# Patient Record
Sex: Male | Born: 1980 | Race: White | Hispanic: No | Smoking: Former smoker
Health system: Southern US, Community
[De-identification: ages and names within clinical notes are randomized; demographics above are authoritative.]

## PROBLEM LIST (undated history)

## (undated) DIAGNOSIS — E7800 Pure hypercholesterolemia, unspecified: Secondary | ICD-10-CM

## (undated) HISTORY — PX: HERNIA REPAIR: SHX51

---

## 2018-10-31 ENCOUNTER — Emergency Department (HOSPITAL_BASED_OUTPATIENT_CLINIC_OR_DEPARTMENT_OTHER): Payer: 59

## 2018-10-31 ENCOUNTER — Observation Stay (HOSPITAL_BASED_OUTPATIENT_CLINIC_OR_DEPARTMENT_OTHER)
Admission: EM | Admit: 2018-10-31 | Discharge: 2018-11-01 | Disposition: A | Discharge status: Home / Self Care | Payer: 59 | Attending: Internal Medicine | Admitting: Internal Medicine

## 2018-10-31 ENCOUNTER — Other Ambulatory Visit: Payer: Self-pay

## 2018-10-31 ENCOUNTER — Encounter (HOSPITAL_BASED_OUTPATIENT_CLINIC_OR_DEPARTMENT_OTHER): Payer: Self-pay | Admitting: *Deleted

## 2018-10-31 DIAGNOSIS — R079 Chest pain, unspecified: Secondary | ICD-10-CM | POA: Diagnosis not present

## 2018-10-31 DIAGNOSIS — R778 Other specified abnormalities of plasma proteins: Secondary | ICD-10-CM

## 2018-10-31 DIAGNOSIS — E7800 Pure hypercholesterolemia, unspecified: Secondary | ICD-10-CM | POA: Diagnosis not present

## 2018-10-31 DIAGNOSIS — R0789 Other chest pain: Secondary | ICD-10-CM | POA: Diagnosis present

## 2018-10-31 DIAGNOSIS — Z87891 Personal history of nicotine dependence: Secondary | ICD-10-CM | POA: Diagnosis not present

## 2018-10-31 DIAGNOSIS — R7989 Other specified abnormal findings of blood chemistry: Secondary | ICD-10-CM

## 2018-10-31 DIAGNOSIS — R001 Bradycardia, unspecified: Secondary | ICD-10-CM | POA: Diagnosis present

## 2018-10-31 DIAGNOSIS — K219 Gastro-esophageal reflux disease without esophagitis: Secondary | ICD-10-CM | POA: Diagnosis not present

## 2018-10-31 HISTORY — DX: Pure hypercholesterolemia, unspecified: E78.00

## 2018-10-31 LAB — APTT: aPTT: 26 seconds (ref 24–36)

## 2018-10-31 LAB — BASIC METABOLIC PANEL
Anion gap: 8 (ref 5–15)
BUN: 14 mg/dL (ref 6–20)
CHLORIDE: 104 mmol/L (ref 98–111)
CO2: 26 mmol/L (ref 22–32)
Calcium: 9.4 mg/dL (ref 8.9–10.3)
Creatinine, Ser: 1.14 mg/dL (ref 0.61–1.24)
GFR calc Af Amer: >60 mL/min (ref >60)
GFR calc non Af Amer: >60 mL/min (ref >60)
GLUCOSE: 108 mg/dL — AB (ref 70–99)
Potassium: 3.5 mmol/L (ref 3.5–5.1)
Sodium: 138 mmol/L (ref 135–145)

## 2018-10-31 LAB — CBC
HCT: 47.3 % (ref 39.0–52.0)
HEMOGLOBIN: 15.5 g/dL (ref 13.0–17.0)
MCH: 28.7 pg (ref 26.0–34.0)
MCHC: 32.8 g/dL (ref 30.0–36.0)
MCV: 87.4 fL (ref 80.0–100.0)
Platelets: 265 10*3/uL (ref 150–400)
RBC: 5.41 MIL/uL (ref 4.22–5.81)
RDW: 13 % (ref 11.5–15.5)
WBC: 7.3 10*3/uL (ref 4.0–10.5)
nRBC: 0 % (ref 0.0–0.2)

## 2018-10-31 LAB — HEPATIC FUNCTION PANEL
ALK PHOS: 47 U/L (ref 38–126)
ALT: 37 U/L (ref 0–44)
AST: 30 U/L (ref 15–41)
Albumin: 4.5 g/dL (ref 3.5–5.0)
Bilirubin, Direct: 0.1 mg/dL (ref 0.0–0.2)
Indirect Bilirubin: 0.3 mg/dL (ref 0.3–0.9)
Total Bilirubin: 0.4 mg/dL (ref 0.3–1.2)
Total Protein: 7.4 g/dL (ref 6.5–8.1)

## 2018-10-31 LAB — PROTIME-INR
INR: 0.94
Prothrombin Time: 12.5 seconds (ref 11.4–15.2)

## 2018-10-31 LAB — LIPASE, BLOOD: Lipase: 38 U/L (ref 11–51)

## 2018-10-31 LAB — BRAIN NATRIURETIC PEPTIDE: B Natriuretic Peptide: 24.7 pg/mL (ref 0.0–100.0)

## 2018-10-31 LAB — D-DIMER, QUANTITATIVE: D-Dimer, Quant: <0.27 ug/mL-FEU (ref 0.00–0.50)

## 2018-10-31 LAB — TROPONIN I
TROPONIN I: 0.05 ng/mL — AB (ref <0.03)
Troponin I: 0.05 ng/mL (ref <0.03)

## 2018-10-31 MED ORDER — PANTOPRAZOLE SODIUM 40 MG PO TBEC
40.0000 mg | DELAYED_RELEASE_TABLET | Freq: Every day | ORAL | Status: DC
  Administered 2018-11-01: 40 mg via ORAL
  Filled 2018-10-31: qty 1

## 2018-10-31 MED ORDER — ONDANSETRON HCL 4 MG/2ML IJ SOLN: 4.0000 mg | Freq: Four times a day (QID) | INTRAMUSCULAR | Status: DC | PRN

## 2018-10-31 MED ORDER — ASPIRIN EC 325 MG PO TBEC
325.0000 mg | DELAYED_RELEASE_TABLET | Freq: Every day | ORAL | Status: DC
  Administered 2018-11-01: 325 mg via ORAL
  Filled 2018-10-31: qty 1

## 2018-10-31 MED ORDER — MORPHINE SULFATE (PF) 2 MG/ML IV SOLN: 2.0000 mg | INTRAVENOUS | Status: DC | PRN

## 2018-10-31 MED ORDER — ACETAMINOPHEN 325 MG PO TABS
650.0000 mg | ORAL_TABLET | ORAL | Status: DC | PRN
  Administered 2018-11-01: 650 mg via ORAL
  Filled 2018-10-31: qty 2

## 2018-10-31 MED ORDER — ASPIRIN 81 MG PO CHEW
324.0000 mg | CHEWABLE_TABLET | Freq: Once | ORAL | Status: AC
  Administered 2018-10-31: 324 mg via ORAL
  Filled 2018-10-31: qty 4

## 2018-10-31 NOTE — ED Notes (Signed)
Patient transported to X-ray 

## 2018-10-31 NOTE — H&P (Signed)
History and Physical    Ray Reed RUE:[id number] DOB: [birth year] DOA: 10/31/2018  PCP: No primary care provider on file.  Patient coming from: Home.  Chief Complaint: Chest pain.  HPI: Ray Reed is a 37 y.o. male with no significant past medical history presents to the ER with complaint of chest pain.  Patient is visiting [place] from [place].  Has been experiencing chest pain last 3 days.  On Friday 3 days ago patient had brief episode of chest pain which resolved without any intervention.  Had again chest pain history while driving which also resolved without any intervention.  Today while at backing up his back he started developing chest pain again with some shortness of breath and diaphoresis with nausea.  Denies any abdominal pain.  Due to the symptoms patient came to the ER.  ED Course: In the ER patient's troponin was mildly elevated EKG was showing deprivation with possible PR depression.  Chest x-ray was unremarkable patient admitted for further work-up of chest pain.  Review of Systems: As per HPI, rest all negative.   Past Medical History:  Diagnosis Date  . High cholesterol     Past Surgical History:  Procedure Laterality Date  . HERNIA REPAIR       reports that he has quit smoking. He has never used smokeless tobacco. He reports current alcohol use. He reports previous drug use.  No Known Allergies  Family History  Problem Relation Age of Onset  . CAD Neg Hx   . Diabetes Mellitus II Neg Hx     Prior to Admission medications   Medication Sig Start Date End Date Taking? Authorizing Provider  omeprazole (PRILOSEC) 20 MG capsule Take 20 mg by mouth daily.   Yes [provider]    Physical Exam: Vitals:   10/31/18 1850 10/31/18 1930 10/31/18 2014 10/31/18 2200  BP: 124/75 132/77 125/75 (!) 141/80  Pulse: 60 63 69 67  Resp: 14 11 16 20   Temp: 98.6 F (37 C)   98.6 F (37 C)  TempSrc: Oral     SpO2: 100% 99% 100% 100%  Weight:       Height:          Constitutional: Moderately built and nourished. Vitals:   10/31/18 1850 10/31/18 1930 10/31/18 2014 10/31/18 2200  BP: 124/75 132/77 125/75 (!) 141/80  Pulse: 60 63 69 67  Resp: 14 11 16 20   Temp: 98.6 F (37 C)   98.6 F (37 C)  TempSrc: Oral     SpO2: 100% 99% 100% 100%  Weight:      Height:       Eyes: Anicteric no pallor. ENMT: No discharge from the ears eyes nose or mouth. Neck: No mass felt.  No neck rigidity no JVD appreciated. Respiratory: No rhonchi or crepitations. Cardiovascular: S1-S2 heard no murmurs appreciated. Abdomen: Soft nontender bowel sounds present. Musculoskeletal: No edema.  No joint effusion. Skin: No rash. Neurologic: Alert awake oriented to time place and person.  Moves all extremities. Psychiatric: Appears normal per normal affect.   Labs on Admission: I have personally reviewed following labs and imaging studies  CBC: Recent Labs  Lab 10/31/18 1730  WBC 7.3  HGB 15.5  HCT 47.3  MCV 87.4  PLT 265   Basic Metabolic Panel: Recent Labs  Lab 10/31/18 1730  NA 138  K 3.5  CL 104  CO2 26  GLUCOSE 108*  BUN 14  CREATININE 1.14  CALCIUM 9.4   GFR: Estimated Creatinine  Clearance: 106 mL/min (by C-G formula based on SCr of 1.14 mg/dL). Liver Function Tests: Recent Labs  Lab 10/31/18 1730  AST 30  ALT 37  ALKPHOS 47  BILITOT 0.4  PROT 7.4  ALBUMIN 4.5   Recent Labs  Lab 10/31/18 1730  LIPASE 38   No results for input(s): AMMONIA in the last 168 hours. Coagulation Profile: Recent Labs  Lab 10/31/18 1730  INR 0.94   Cardiac Enzymes: Recent Labs  Lab 10/31/18 1730 10/31/18 2010  TROPONINI 0.05* 0.05*   BNP (last 3 results) No results for input(s): PROBNP in the last 8760 hours. HbA1C: No results for input(s): HGBA1C in the last 72 hours. CBG: No results for input(s): GLUCAP in the last 168 hours. Lipid Profile: No results for input(s): CHOL, HDL, LDLCALC, TRIG, CHOLHDL, LDLDIRECT in the  last 72 hours. Thyroid Function Tests: No results for input(s): TSH, T4TOTAL, FREET4, T3FREE, THYROIDAB in the last 72 hours. Anemia Panel: No results for input(s): VITAMINB12, FOLATE, FERRITIN, TIBC, IRON, RETICCTPCT in the last 72 hours. Urine analysis: No results found for: COLORURINE, APPEARANCEUR, LABSPEC, PHURINE, GLUCOSEU, HGBUR, BILIRUBINUR, KETONESUR, PROTEINUR, UROBILINOGEN, NITRITE, LEUKOCYTESUR Sepsis Labs: @LABRCNTIP (procalcitonin:4,lacticidven:4) )No results found for this or any previous visit (from the past 240 hour(s)).   Radiological Exams on Admission: Dg Chest 2 View  Result Date: 10/31/2018 CLINICAL DATA:  Chest pain EXAM: CHEST - 2 VIEW COMPARISON:  None. FINDINGS: Heart and mediastinal contours are within normal limits. No focal opacities or effusions. No acute bony abnormality. IMPRESSION: No active cardiopulmonary disease. Electronically Signed   By: Charlett NoseKevin  Dover M.D.   On: 10/31/2018 17:27    EKG: Independently reviewed.  Normal sinus rhythm with J-point elevation probably secondary to early repolarization changes with possible PR depression.  Assessment/Plan Principal Problem:   Chest pain Active Problems:   Chest pain, rule out acute myocardial infarction    1. Chest pain -with elevated positive troponin we will keep patient on aspirin we will cycle further cardiac markers check d-dimer since patient has possible PR depression will avoid Lovenox for now.  Check 2D echo sed rate cardiology consult.  Check urine drug screen. 2. Previous episode of high-altitude pulmonary edema.   DVT prophylaxis: SCDs. Code Status: Full code. Family Communication: Patient's family at the bedside. Disposition Plan: Home. Consults called: Cardiology. Admission status: Observation.   Eduard ClosArshad N Tressa Maldonado MD [facility] Pager [phone].  If 7PM-7AM, please contact night-coverage [email or web address] Password [email or web address]  10/31/2018, 11:14 PM

## 2018-10-31 NOTE — ED Notes (Addendum)
Pt had pulmonary edema 1 year ago and felt he needed to be checked today. Pt states he has chest pain that he rates 2/10 and left shoulder pain that has been ongoing for approx. 2 days. Pt states the pain is intermitent and is also having bouts of diaphoresis and anxiety. He states that it feels like his heart intermittently races and then he feels like he needs to have a bowel movement.

## 2018-10-31 NOTE — ED Provider Notes (Signed)
[facility] EMERGENCY DEPARTMENT Provider Note   CSN: [id number] Arrival date & time: 10/31/18  1650     History   Chief Complaint Chief Complaint  Patient presents with  . Chest Pain    HPI Ray Reed is a 37 y.o. male with a past medical history of high cholesterol, GERD, who presents today for evaluation of 2 out of 10 chest discomfort and left shoulder pain.  He says that this is been going on for approximately 2 weeks however has gotten gradually worse today.  He says the pain is intermittent and comes and goes.  He went to an urgent care initially where they were concerned and sent him to the emergency room.  He says that this made him feel extremely anxious.  This is corroborated by his wife who is in the room who tells me that he was telling her to run red lights to get here.    Patient has a remote history of high-altitude pulmonary edema that he got 1 year ago in [place], however has not had any issues since.  He reports that he will start feeling very anxious and then get nauseous and diaphoretic.  He has not vomited.  He denies any abdominal pain constipation or diarrhea.  He does note that he is very active, snowboards, and plays hockey.  He is visiting here.  He denies any history of blood clots.  He denies any leg swelling, hemoptysis, prolonged surgeries or immobilization in the past 4 weeks.  He does not use any hormones.    HPI  Past Medical History:  Diagnosis Date  . High cholesterol     Patient Active Problem List   Diagnosis Date Noted  . Chest pain, rule out acute myocardial infarction 10/31/2018  . Chest pain 10/31/2018    Past Surgical History:  Procedure Laterality Date  . HERNIA REPAIR          Home Medications    Prior to Admission medications   Medication Sig Start Date End Date Taking? Authorizing Provider  omeprazole (PRILOSEC) 20 MG capsule Take 20 mg by mouth daily.   Yes [provider]    Family  History Family History  Problem Relation Age of Onset  . CAD Neg Hx   . Diabetes Mellitus II Neg Hx     Social History Social History   Tobacco Use  . Smoking status: Former Games developer  . Smokeless tobacco: Never Used  Substance Use Topics  . Alcohol use: Yes    Comment: 10 drinks max over a weekend  . Drug use: Not Currently     Allergies   Patient has no known allergies.   Review of Systems Review of Systems  Constitutional: Positive for diaphoresis. Negative for chills and fever.  HENT: Negative for congestion.   Respiratory: Negative for chest tightness and shortness of breath.   Cardiovascular: Positive for chest pain. Negative for palpitations and leg swelling.  Gastrointestinal: Positive for nausea. Negative for abdominal pain.  Genitourinary: Negative for dysuria.  Musculoskeletal: Negative for back pain and neck pain.  Psychiatric/Behavioral: Negative for confusion. The patient is nervous/anxious.   All other systems reviewed and are negative.    Physical Exam Updated Vital Signs BP (!) 141/80   Pulse 67   Temp 98.6 F (37 C)   Resp 20   Ht 6\' 3"  (1.905 m)   Wt 98.9 kg   SpO2 100%   BMI 27.25 kg/m   Physical Exam Vitals signs and nursing  note reviewed.  Constitutional:      General: He is not in acute distress.    Appearance: He is well-developed and normal weight. He is not toxic-appearing or diaphoretic.  HENT:     Head: Normocephalic and atraumatic.  Eyes:     Conjunctiva/sclera: Conjunctivae normal.  Neck:     Musculoskeletal: Normal range of motion and neck supple.     Vascular: No JVD.  Cardiovascular:     Rate and Rhythm: Normal rate and regular rhythm.     Pulses:          Posterior tibial pulses are 2+ on the right side and 2+ on the left side.     Heart sounds: Normal heart sounds. No murmur.  Pulmonary:     Effort: Pulmonary effort is normal. No accessory muscle usage or respiratory distress.     Breath sounds: Normal breath  sounds. No decreased breath sounds, wheezing, rhonchi or rales.  Chest:     Chest wall: Tenderness (Diffuse tenderness over anterior chest, primarily over sternocostal joints, palpation in these areas both re-creates and exacerbates his reported pain.) present.  Abdominal:     Palpations: Abdomen is soft.     Tenderness: There is no abdominal tenderness.  Musculoskeletal: Normal range of motion.     Right lower leg: He exhibits no tenderness. No edema.     Left lower leg: He exhibits no tenderness. No edema.  Skin:    General: Skin is warm and dry.  Neurological:     Mental Status: He is alert and oriented to person, place, and time.     Motor: No weakness.  Psychiatric:        Mood and Affect: Mood is anxious (Fidgeting).      ED Treatments / Results  Labs (all labs ordered are listed, but only abnormal results are displayed) Labs Reviewed  BASIC METABOLIC PANEL - Abnormal; Notable for the following components:      Result Value   Glucose, Bld 108 (*)    All other components within normal limits  TROPONIN I - Abnormal; Notable for the following components:   Troponin I 0.05 (*)    All other components within normal limits  CBC  HEPATIC FUNCTION PANEL  LIPASE, BLOOD  BRAIN NATRIURETIC PEPTIDE  PROTIME-INR  APTT  D-DIMER, QUANTITATIVE (NOT AT [facility])  TROPONIN I    EKG EKG Interpretation  Date/Time:  Sunday October 31 2018 16:59:50 EST Ventricular Rate:  68 PR Interval:  154 QRS Duration: 96 QT Interval:  394 QTC Calculation: 418 R Axis:   82 Text Interpretation:  Normal sinus rhythm with sinus arrhythmia Normal ECG Nonspecific TW changes lead III J point notching likely benign early repolarization Mild PR depression No previous ECGs available Confirmed by Alvira MondaySchlossman, Erin ([id number]) on 10/31/2018 6:18:13 PM   Radiology Dg Chest 2 View  Result Date: 10/31/2018 CLINICAL DATA:  Chest pain EXAM: CHEST - 2 VIEW COMPARISON:  None. FINDINGS: Heart and mediastinal  contours are within normal limits. No focal opacities or effusions. No acute bony abnormality. IMPRESSION: No active cardiopulmonary disease. Electronically Signed   By: Charlett NoseKevin  Dover M.D.   On: 10/31/2018 17:27    Procedures Procedures (including critical care time) CRITICAL CARE Performed by: Lyndel SafeElizabeth Daquavion Catala Total critical care time: 40 minutes Critical care time was exclusive of separately billable procedures and treating other patients. Critical care was necessary to treat or prevent imminent or life-threatening deterioration. Critical care was time spent personally by me on the following  activities: development of treatment plan with patient and/or surrogate as well as nursing, discussions with consultants, evaluation of patient's response to treatment, examination of patient, obtaining history from patient or surrogate, ordering and performing treatments and interventions, ordering and review of laboratory studies, ordering and review of radiographic studies, pulse oximetry and re-evaluation of patient's condition.  Elevated troponin, transfer for admission.     Medications Ordered in ED Medications  pantoprazole (PROTONIX) EC tablet 40 mg (has no administration in time range)  acetaminophen (TYLENOL) tablet 650 mg (has no administration in time range)  ondansetron (ZOFRAN) injection 4 mg (has no administration in time range)  morphine 2 MG/ML injection 2 mg (has no administration in time range)  aspirin EC tablet 325 mg (has no administration in time range)  aspirin chewable tablet 324 mg (324 mg Oral Given 10/31/18 1827)     Initial Impression / Assessment and Plan / ED Course  I have reviewed the triage vital signs and the nursing notes.  Pertinent labs & imaging results that were available during my care of the patient were reviewed by me and considered in my medical decision making (see chart for details).  Clinical Course as of Nov 01 121  Wynelle Link Oct 31, 2018  1806 Troponin  I(!!): 0.05 [EH]  1830 Spoke with Dr. Daphine Deutscher from cardiology.  She says that it would be very atypical for constant pain for over 24 hours from an MI to have a troponin of 0.05.  Recommend trending troponins, if they go down can consider discharge.  No need to heparinize patient.    [EH]  1948 [facility]oke with [facility].    [EH]    Clinical Course User Index [EH] Cristina Gong, PA-C   [place] presents today for evaluation of of constant chest pain and left shoulder pain for over 24 hours with occasional nausea and diaphoresis.  CBC and BMP without significant electrolyte or hematologic derangement.  Hepatic function panel unremarkable.  Lipase is not elevated.  D-dimer is negative.  Troponin is slightly elevated at 0.05.  I spoke with cardiology who feels that this would be very atypical for AMI, stated patient does not need to be heparinized and recommended trending troponins.  Patient is given ASA.  I spoke with Dr. Julian Reil from [facility] who agrees to admit patient to [facility] for continued monitoring.  Patient was transferred by care link to [facility] hospital.    Final Clinical Impressions(s) / ED Diagnoses   Final diagnoses:  Elevated troponin  Chest pain, unspecified type    ED Discharge Orders    None       Cristina Gong, PA-C 11/01/18 0127    Alvira Monday, MD 11/02/18 0700

## 2018-10-31 NOTE — Plan of Care (Signed)
37 yo M with CP, h/o HLD former smoker.  Trop 0.05, T wave flattening in lead III, going to tele for CP r/o.

## 2018-10-31 NOTE — ED Triage Notes (Signed)
Pt reports chest pain since Friday, radiating to left shoulder. He was see at Urgent Care and had an ekg and was sent here for further evalution

## 2018-10-31 NOTE — ED Notes (Signed)
Pt ambulatory to bathroom. No distress noted. No chest pain at this time.

## 2018-10-31 NOTE — ED Notes (Signed)
Date and time results received: 10/31/18 1805    Test: trop Critical Value: 0.05 Name of Provider Notified: Lanora ManisElizabeth, GeorgiaPA Orders Received? Or Actions Taken?: no orders given

## 2018-10-31 NOTE — ED Notes (Signed)
ED Provider at bedside. 

## 2018-10-31 NOTE — ED Notes (Signed)
Pt transported to [facility] via carelink

## 2018-11-01 ENCOUNTER — Observation Stay (HOSPITAL_COMMUNITY): Payer: 59

## 2018-11-01 DIAGNOSIS — R079 Chest pain, unspecified: Secondary | ICD-10-CM

## 2018-11-01 DIAGNOSIS — R001 Bradycardia, unspecified: Secondary | ICD-10-CM | POA: Diagnosis present

## 2018-11-01 DIAGNOSIS — R7989 Other specified abnormal findings of blood chemistry: Secondary | ICD-10-CM

## 2018-11-01 DIAGNOSIS — E7800 Pure hypercholesterolemia, unspecified: Secondary | ICD-10-CM

## 2018-11-01 DIAGNOSIS — R778 Other specified abnormalities of plasma proteins: Secondary | ICD-10-CM | POA: Diagnosis present

## 2018-11-01 LAB — LIPID PANEL
CHOLESTEROL: 201 mg/dL — AB (ref 0–200)
HDL: 49 mg/dL (ref >40)
LDL Cholesterol: 141 mg/dL — ABNORMAL HIGH (ref 0–99)
Total CHOL/HDL Ratio: 4.1 RATIO
Triglycerides: 53 mg/dL (ref <150)
VLDL: 11 mg/dL (ref 0–40)

## 2018-11-01 LAB — HIV ANTIBODY (ROUTINE TESTING W REFLEX): HIV Screen 4th Generation wRfx: NONREACTIVE

## 2018-11-01 LAB — HEMOGLOBIN A1C
Hgb A1c MFr Bld: 5.2 % (ref 4.8–5.6)
Mean Plasma Glucose: 102.54 mg/dL

## 2018-11-01 LAB — TROPONIN I
Troponin I: 0.05 ng/mL (ref <0.03)
Troponin I: 0.05 ng/mL (ref <0.03)
Troponin I: 0.05 ng/mL (ref <0.03)

## 2018-11-01 LAB — D-DIMER, QUANTITATIVE: D-Dimer, Quant: <0.27 ug/mL-FEU (ref 0.00–0.50)

## 2018-11-01 LAB — RAPID URINE DRUG SCREEN, HOSP PERFORMED
Amphetamines: NOT DETECTED
Barbiturates: NOT DETECTED
Benzodiazepines: NOT DETECTED
Cocaine: NOT DETECTED
Opiates: NOT DETECTED
TETRAHYDROCANNABINOL: NOT DETECTED

## 2018-11-01 LAB — C-REACTIVE PROTEIN: CRP: <0.8 mg/dL (ref <1.0)

## 2018-11-01 LAB — SEDIMENTATION RATE: Sed Rate: 1 mm/hr (ref 0–16)

## 2018-11-01 MED ORDER — LORAZEPAM 0.5 MG PO TABS
0.5000 mg | ORAL_TABLET | Freq: Once | ORAL | Status: AC
  Administered 2018-11-01: 0.5 mg via ORAL
  Filled 2018-11-01: qty 1

## 2018-11-01 MED ORDER — NITROGLYCERIN 0.4 MG SL SUBL
SUBLINGUAL_TABLET | SUBLINGUAL | Status: AC
  Filled 2018-11-01: qty 2

## 2018-11-01 MED ORDER — IOPAMIDOL (ISOVUE-370) INJECTION 76%
80.0000 mL | Freq: Once | INTRAVENOUS | Status: AC | PRN
  Administered 2018-11-01: 80 mL via INTRAVENOUS

## 2018-11-01 MED ORDER — NITROGLYCERIN 0.4 MG SL SUBL
0.8000 mg | SUBLINGUAL_TABLET | Freq: Once | SUBLINGUAL | Status: AC
  Administered 2018-11-01: 0.8 mg via SUBLINGUAL

## 2018-11-01 NOTE — Discharge Summary (Signed)
Physician Discharge Summary  Ray Reed UXL:[id number] DOB: [birth year] DOA: 10/31/2018  PCP: No primary care provider on file.  Admit date: 10/31/2018 Discharge date: 11/01/2018  Time spent: 45 minutes  Recommendations for Outpatient Follow-up:  1. Follow up with PCP regarding anxiety, and cholesterol control   Discharge Diagnoses:  Principal Problem:   Chest pain, rule out acute myocardial infarction Active Problems:   Bradycardia   Elevated troponin   Pure hypercholesterolemia   Discharge Condition: stable  Diet recommendation: heart healthy  Filed Weights   10/31/18 1659  Weight: 98.9 kg    History of present illness:  Ray Reed is a 37 y.o. male, former reactional smoker with a hx of HLD and GERD who admitted for the evaluation of chest pain. He takes Prilosec as an outpatient. No other prescriptions meds. No known FH of heart disease  Hospital Course:  1. Chest Pain: his CP seemed more c/w musculoskeletal chest wall pain. ? Pulled muscle after playing hockey last week. He had some reproducible pain with palpation of CP. No exertional symptoms.  He had elevated troponin but trend is flat and not c/w ACS (0.05>.0.05>>0.05). He noted generalized malaise. ? Viral syndrome.  echo done in [place], [place] 11/2017. This showed normal LVEF and no significant valvular abnormalities. Other than h/o high cholesterol, he has no other CRFs. Coronary CTA with calcium score 0 and no plaque. LDL 144 and cholesterol 201.   Procedures:  CT heart  Consultations:  Dr Excell Seltzer cards  Discharge Exam: Vitals:   10/31/18 2200 11/01/18 0458  BP: (!) 141/80 121/78  Pulse: 67 (!) 46  Resp: 20 18  Temp: 98.6 F (37 C) 98.5 F (36.9 C)  SpO2: 100% 96%    General: well nourished no acute distress Cardiovascular: rrr no mgr no LE edema Respiratory: normal effort BS clear bilaterally no wheezes  Discharge Instructions   Discharge Instructions    Call MD for:  difficulty  breathing, headache or visual disturbances   Complete by:  As directed    Call MD for:  extreme fatigue   Complete by:  As directed    Call MD for:  persistant dizziness or light-headedness   Complete by:  As directed    Diet - low sodium heart healthy   Complete by:  As directed    Discharge instructions   Complete by:  As directed    Follow up with PCP regarding anxiety and cholesterol and LDL levels   Increase activity slowly   Complete by:  As directed      Allergies as of 11/01/2018   No Known Allergies     Medication List    TAKE these medications   calcium carbonate 750 MG chewable tablet Commonly known as:  TUMS EX Chew 2 tablets by mouth as needed for heartburn.   cetirizine 10 MG tablet Commonly known as:  ZYRTEC Take 10 mg by mouth daily.   fluticasone 50 MCG/ACT nasal spray Commonly known as:  FLONASE Place 1 spray into both nostrils daily as needed for allergies or rhinitis.   ibuprofen 200 MG tablet Commonly known as:  ADVIL,MOTRIN Take 600 mg by mouth as needed for moderate pain.   omeprazole 20 MG capsule Commonly known as:  PRILOSEC Take 20 mg by mouth daily as needed (indigestion).      No Known Allergies    The results of significant diagnostics from this hospitalization (including imaging, microbiology, ancillary and laboratory) are listed below for reference.    Significant Diagnostic  Studies: Dg Chest 2 View  Result Date: 10/31/2018 CLINICAL DATA:  Chest pain EXAM: CHEST - 2 VIEW COMPARISON:  None. FINDINGS: Heart and mediastinal contours are within normal limits. No focal opacities or effusions. No acute bony abnormality. IMPRESSION: No active cardiopulmonary disease. Electronically Signed   By: Charlett Nose M.D.   On: 10/31/2018 17:27   Ct Coronary Morph W/cta Cor W/score W/ca W/cm &/or Wo/cm  Addendum Date: 11/01/2018   ADDENDUM REPORT: 11/01/2018 16:22 EXAM: OVER-READ INTERPRETATION  CT CHEST The following report is an over-read  performed by radiologist Dr. Lesia Hausen [facility] on 11/01/2018. This over-read does not include interpretation of cardiac or coronary anatomy or pathology. The coronary CTA interpretation by the cardiologist is attached. COMPARISON:  Chest radiograph from one day prior. FINDINGS: Cardiovascular: No significant pericardial effusion/thickening. Great vessels are normal in course and caliber. No central pulmonary emboli. Mediastinum/Nodes: Small fluid level in dependent lower thoracic esophagus. No pathologically enlarged mediastinal or hilar lymph nodes. Coarsely calcified nonenlarged subcarinal and right hilar nodes from prior granulomatous disease. Lungs/Pleura: No pneumothorax. No pleural effusion. No acute consolidative airspace disease, lung masses or significant pulmonary nodules. Upper abdomen: Small hiatal hernia. Musculoskeletal:  No aggressive appearing focal osseous lesions. IMPRESSION: Small hiatal hernia. Fluid in the lower thoracic esophagus, suggesting esophageal dysmotility and/or gastroesophageal reflux. No additional significant extracardiac findings. Electronically Signed   By: Delbert Phenix M.D.   On: 11/01/2018 16:22   Result Date: 11/01/2018 CLINICAL DATA:  61M with hyperlipidemia, GERD and chest pain. EXAM: Cardiac/Coronary  CT TECHNIQUE: The patient was scanned on a [employer]. FINDINGS: A 120 kV prospective scan was triggered in the descending thoracic aorta at 111 HU's. Axial non-contrast 3 mm slices were carried out through the heart. The data set was analyzed on a dedicated work station and scored using the Agatson method. Gantry rotation speed was 250 msecs and collimation was .6 mm. No beta blockade and 0.8 mg of sl NTG was given. The 3D data set was reconstructed in 5% intervals of the 67-82 % of the R-R cycle. Diastolic phases were analyzed on a dedicated work station using MPR, MIP and VRT modes. The patient received 80 cc of contrast. Aorta: Normal  size. Ascending aorta 3.0 cm. No calcifications. No dissection. Aortic Valve:  Trileaflet.  No calcifications. Coronary Arteries:  Normal coronary origin.  Right dominance. RCA is a large dominant artery that gives rise to PDA and PLVB. There is no plaque. Left main is a large artery that gives rise to LAD and LCX arteries. LAD is a large vessel that has no plaque. There is a large, branching diagonal that has no plaque. LCX is a non-dominant artery that gives rise to two large OM branches. There is no plaque. Other findings: Normal pulmonary vein drainage into the left atrium. Normal let atrial appendage without a thrombus. Normal size of the pulmonary artery. IMPRESSION: 1. Coronary calcium score of 0. This was 0 percentile for age and sex matched control. 2. Normal coronary origin with right dominance. 3. No evidence of CAD. 4.  Radiologist over-read of the chest to follow. Chilton Si, MD Electronically Signed: By: Chilton Si On: 11/01/2018 15:04    Microbiology: No results found for this or any previous visit (from the past 240 hour(s)).   Labs: Basic Metabolic Panel: Recent Labs  Lab 10/31/18 1730  NA 138  K 3.5  CL 104  CO2 26  GLUCOSE 108*  BUN 14  CREATININE 1.14  CALCIUM 9.4   Liver Function Tests: Recent Labs  Lab 10/31/18 1730  AST 30  ALT 37  ALKPHOS 47  BILITOT 0.4  PROT 7.4  ALBUMIN 4.5   Recent Labs  Lab 10/31/18 1730  LIPASE 38   No results for input(s): AMMONIA in the last 168 hours. CBC: Recent Labs  Lab 10/31/18 1730  WBC 7.3  HGB 15.5  HCT 47.3  MCV 87.4  PLT 265   Cardiac Enzymes: Recent Labs  Lab 10/31/18 1730 10/31/18 2010 10/31/18 2341 11/01/18 0534 11/01/18 1115  TROPONINI 0.05* 0.05* 0.05* 0.05* 0.05*   BNP: BNP (last 3 results) Recent Labs    10/31/18 1730  BNP 24.7    ProBNP (last 3 results) No results for input(s): PROBNP in the last 8760 hours.  CBG: No results for input(s): GLUCAP in the last 168  hours.     SignedGwenyth Bender:  BLACK,KAREN M NP [facility] 11/01/2018, 4:29 PM

## 2018-11-01 NOTE — Consult Note (Signed)
Cardiology Consultation:   Patient ID: Ray Reed MRN: [id number]; DOB: Dec 11, 1980  Admit date: 10/31/2018 Date of Consult: 11/01/2018  Primary Care Provider: No primary care provider on file. Primary Cardiologist: New Primary Electrophysiologist:  None    Patient Profile:   Ray Reed is a 37 y.o. male, former recreational smoker with a hx of HLD and GERD who is being seen today for the evaluation of chest pain at the request of Dr. Cyndia Diver, Internal Medicine.   History of Present Illness:   Ray Reed is a 37 y.o. male, former reactional smoker with a hx of HLD and GERD who is being seen today for the evaluation of chest pain at the request of Dr. Cyndia Diver, Internal Medicine. He takes Prilosec as an outpatient. No other prescriptions meds. No known FH of heart disease.   He reports being evaluated at a hospital in [place] last January for evaluation for dyspnea and chest pain.  He was they are vacationing.  Snowboarding with friends.  He went to the ED for chest pain and dyspnea and was diagnosed with high altitude pulmonary edema.  He reports that his symptoms completely resolved after he was given supplemental oxygen. Does not recall any other meds (unsure if he required lasix). He recalls that his troponins were mildly elevated at that time however levels trended downward (0.3>>0.28).  He recalls getting a chest CT that ruled out PE (radiologist report in Care Everywhere noted moderate bilateral multifocal PNA but CXR was negative).  He also recalls getting an echocardiogram and was told that it was normal (echo report reviewed, EF normal and no significant valvular dz).Marland Kitchen  He reports that a stress test was considered however given the fact that he was feeling much better after supplemental oxygen and the fact that his troponin levels were trending down and echo was normal, they elected not to pursue stress testing.  After returning home to [place] he no recurrent  issues.  He is fairly active.  He plays hockey and basketball 2 to 3 days a week.  No exertional symptoms.  He played hockey last week and had no limitations.  Despite being active he admits to a very poor diet.  He is concerned about his cholesterol and asking if it can be checked today.  He lives in [place] but is here visiting his parents.  He reports over the last 2 to 3 days he just has not been feeling well.  He notes generalized malaise.  He also notes new substernal chest discomfort with slight radiation to the left side.  It is different from the chest pain that he felt in [place] last year.  It is a dull ache.  Somewhat worse with palpation of the chest wall.  Nonexertional. Not pleuritic. He has had some posterior neck pain. Not worse with any meals.  He thinks that he may have pulled a muscle however he is concerned regarding his generalized malaise.  He also felt panicky the other day and felt like his heart was racing.  This discomfort worsened yesterday so he had his girlfriend drive him to the ED.  The ED, EKG showed SR with nonspecific Twave abnormalities in lead III. ? J point elevation. CXR negative. BMP and CBC unremarkable. Troponin mildly elevated x 3 with flat trend, 0.05>>0.05>>0.05. D-dimer negative.  Inflammatory markers, CRP and ESR normal. Lipase negative. BP normal. Tele shows sinus bradycardia w/ HR in the upper 40s.    Past Medical History:  Diagnosis Date  . High  cholesterol     Past Surgical History:  Procedure Laterality Date  . HERNIA REPAIR       Home Medications:  Prior to Admission medications   Medication Sig Start Date End Date Taking? Authorizing Provider  omeprazole (PRILOSEC) 20 MG capsule Take 20 mg by mouth daily.   Yes [provider]    Inpatient Medications: Scheduled Meds: . aspirin EC  325 mg Oral Daily  . pantoprazole  40 mg Oral Daily   Continuous Infusions:  PRN Meds: acetaminophen, morphine injection, ondansetron  (ZOFRAN) IV  Allergies:   No Known Allergies  Social History:   Social History   Socioeconomic History  . Marital status: Unknown    Spouse name: Not on file  . Number of children: Not on file  . Years of education: Not on file  . Highest education level: Not on file  Occupational History  . Not on file  Social Needs  . Financial resource strain: Not on file  . Food insecurity:    Worry: Not on file    Inability: Not on file  . Transportation needs:    Medical: Not on file    Non-medical: Not on file  Tobacco Use  . Smoking status: Former Research scientist (life sciences)  . Smokeless tobacco: Never Used  Substance and Sexual Activity  . Alcohol use: Yes    Comment: 10 drinks max over a weekend  . Drug use: Not Currently  . Sexual activity: Not on file  Lifestyle  . Physical activity:    Days per week: Not on file    Minutes per session: Not on file  . Stress: Not on file  Relationships  . Social connections:    Talks on phone: Not on file    Gets together: Not on file    Attends religious service: Not on file    Active member of club or organization: Not on file    Attends meetings of clubs or organizations: Not on file    Relationship status: Not on file  . Intimate partner violence:    Fear of current or ex partner: Not on file    Emotionally abused: Not on file    Physically abused: Not on file    Forced sexual activity: Not on file  Other Topics Concern  . Not on file  Social History Narrative  . Not on file    Family History:    Family History  Problem Relation Age of Onset  . CAD Neg Hx   . Diabetes Mellitus II Neg Hx      ROS:  Please see the history of present illness.   All other ROS reviewed and negative.     Physical Exam/Data:   Vitals:   10/31/18 1930 10/31/18 2014 10/31/18 2200 11/01/18 0458  BP: 132/77 125/75 (!) 141/80 121/78  Pulse: 63 69 67 (!) 46  Resp: '11 16 20 18  '$ Temp:   98.6 F (37 C) 98.5 F (36.9 C)  TempSrc:    Oral  SpO2: 99% 100% 100%  96%  Weight:      Height:       No intake or output data in the 24 hours ending 11/01/18 0720 Filed Weights   10/31/18 1659  Weight: 98.9 kg   Body mass index is 27.25 kg/m.  General:  Young WM, Well nourished, well developed, in no acute distress HEENT: normal Lymph: no adenopathy Neck: no JVD Endocrine:  No thryomegaly Vascular: No carotid bruits; FA pulses 2+ bilaterally without  bruits  Cardiac:  normal S1, S2; RRR; no murmur  Lungs:  clear to auscultation bilaterally, no wheezing, rhonchi or rales  Abd: soft, nontender, no hepatomegaly  Ext: no edema Musculoskeletal:  No deformities, BUE and BLE strength normal and equal Skin: warm and dry  Neuro:  CNs 2-12 intact, no focal abnormalities noted Psych:  Normal affect   EKG:  The EKG was personally reviewed and demonstrates:  SR with TW abnormality in lead III, ? J point elevation,  Telemetry:  Telemetry was personally reviewed and demonstrates:  Sinus brady upper 40s.   Relevant CV Studies: 2D Echo 12/06/17 ([place], [place])   Conclusions   Summary  Normal LV size, systolic function, and wall motion with an EF of 60-65%.  No significant valve disease  RVSP estimated at 33 mmHg.  Laboratory Data:  Chemistry Recent Labs  Lab 10/31/18 1730  NA 138  K 3.5  CL 104  CO2 26  GLUCOSE 108*  BUN 14  CREATININE 1.14  CALCIUM 9.4  GFRNONAA >60  GFRAA >60  ANIONGAP 8    Recent Labs  Lab 10/31/18 1730  PROT 7.4  ALBUMIN 4.5  AST 30  ALT 37  ALKPHOS 47  BILITOT 0.4   Hematology Recent Labs  Lab 10/31/18 1730  WBC 7.3  RBC 5.41  HGB 15.5  HCT 47.3  MCV 87.4  MCH 28.7  MCHC 32.8  RDW 13.0  PLT 265   Cardiac Enzymes Recent Labs  Lab 10/31/18 1730 10/31/18 2010 10/31/18 2341 11/01/18 0534  TROPONINI 0.05* 0.05* 0.05* 0.05*   No results for input(s): TROPIPOC in the last 168 hours.  BNP Recent Labs  Lab 10/31/18 1730  BNP 24.7    DDimer  Recent Labs  Lab 10/31/18 1730 10/31/18 2341  DDIMER  <0.27 <0.27    Radiology/Studies:  Dg Chest 2 View  Result Date: 10/31/2018 CLINICAL DATA:  Chest pain EXAM: CHEST - 2 VIEW COMPARISON:  None. FINDINGS: Heart and mediastinal contours are within normal limits. No focal opacities or effusions. No acute bony abnormality. IMPRESSION: No active cardiopulmonary disease. Electronically Signed   By: Rolm Baptise M.D.   On: 10/31/2018 17:27    Assessment and Plan:   Ray Reed is a 37 y.o. male, former reactional smoker with a hx of HLD and GERD who is being seen today for the evaluation of chest pain at the request of Dr. Cyndia Diver, Internal Medicine. He takes Prilosec as an outpatient. No other prescriptions meds. No known FH of heart disease.   1. Chest Pain: his CP seems more c/w musculoskeletal chest wall pain. ? Pulled muscle after playing hockey last week. He has some reproducible pain with palpation of CP. No exertional symptoms. Can try trial of Toradol. He does have mildly elevated troponin but trend is flat and not c/w ACS (0.05>.0.05>>0.05). He notes generalized malaise. ? Viral syndrome. I reviewed echo done in [place], [place] 11/2017. This showed normal LVEF and no significant valvular abnormalities. Other than h/o high cholesterol, he has no other CRFs. We will check FLP today as he is very concerned and would like to get a new baseline of lipids. Dr. Oval Linsey to see and will determine if additional cardiac w/u should be pursued. Given his baseline sinus bradycardia, may consider coronary CTA to assess coronaries of ETT.    For questions or updates, please contact [facility] Please consult [email or web address] for contact info under     Signed, Lyda Jester, PA-C  11/01/2018 7:20 AM

## 2018-11-01 NOTE — Plan of Care (Signed)
Patient discharged per MD order, All instructions wiill be given in verbal and written format to patient and wife at bedside.

## 2019-11-26 IMAGING — CT CT HEART MORP W/ CTA COR W/ SCORE W/ CA W/CM &/OR W/O CM
4 of 7 series · 8 of 20 positions shown, 9 images · IV contrast (APPLIED)
Comparison: Chest radiograph from one day prior.

Addendum:
CLINICAL DATA: 37M with hyperlipidemia, GERD and chest pain.

EXAM:
Cardiac/Coronary  CT
TECHNIQUE: The patient was scanned on a [employer] Force scanner.

[Series 6: best diast 74 % · axial · 0.36mm/px · z∈[+1192,+1233]mm · 2 of 303 slices shown, 3 images]
[im 101/303  vessel]
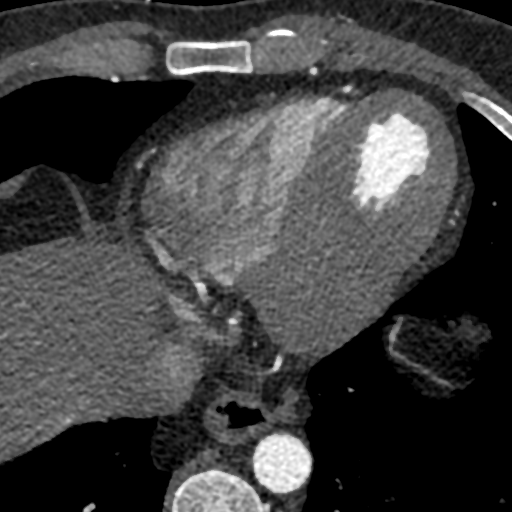
[im 101/303  lung]
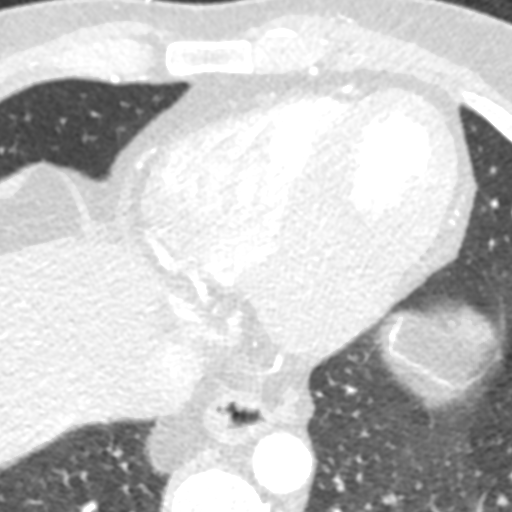
[im 202/303  vessel]
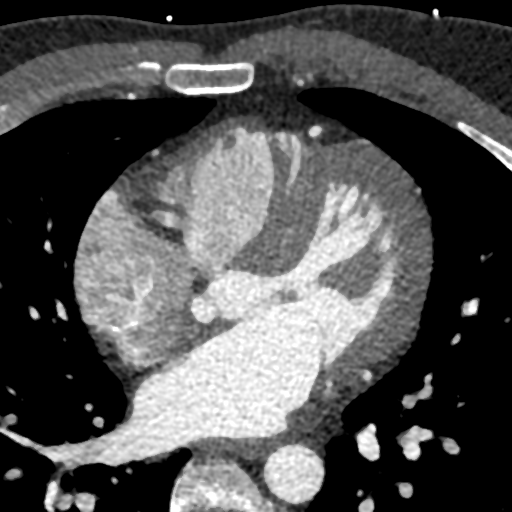

[Series 7: best syst 33 % · axial · 0.36mm/px · z∈[+1192,+1233]mm · 2 of 303 slices shown]
[im 101/303  vessel]
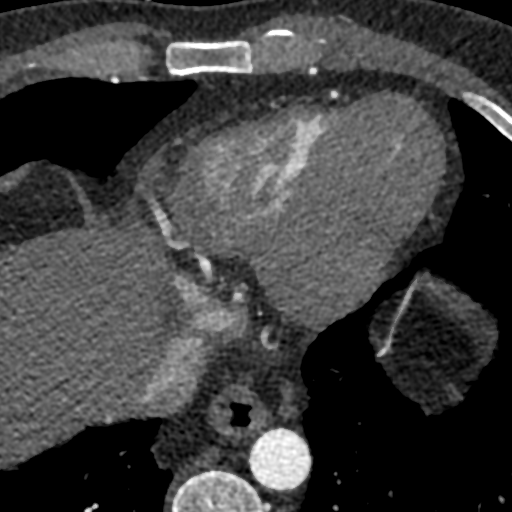
[im 202/303  vessel]
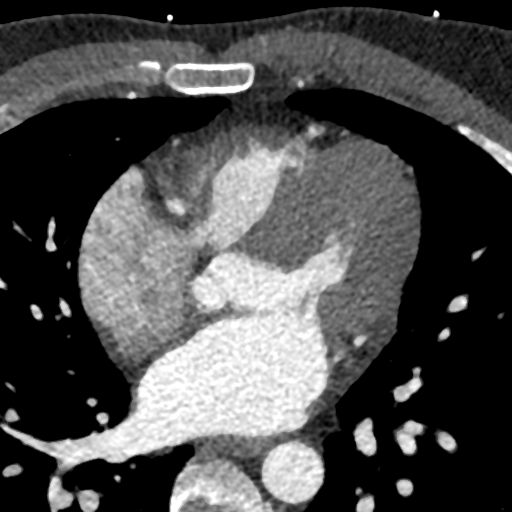

[Series 8: ts diast sharp 74 % · axial · 0.36mm/px · z∈[+1192,+1233]mm · 2 of 303 slices shown]
[im 101/303  lung]
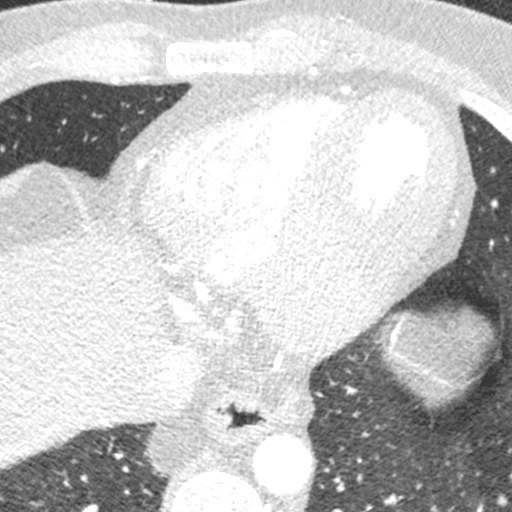
[im 202/303  lung]
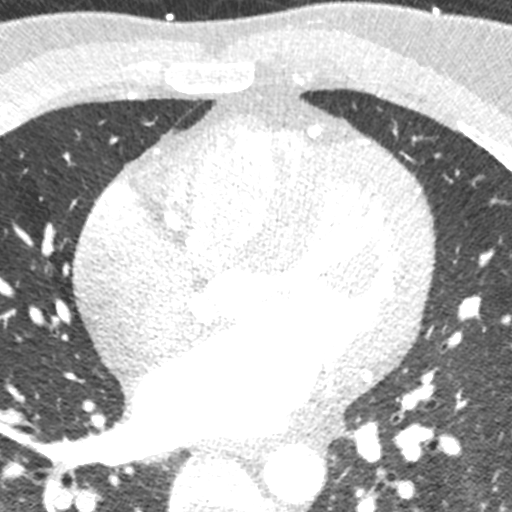

[Series 9: ts syst sharp 33 % · axial · 0.36mm/px · z∈[+1192,+1233]mm · 2 of 303 slices shown]
[im 101/303  lung]
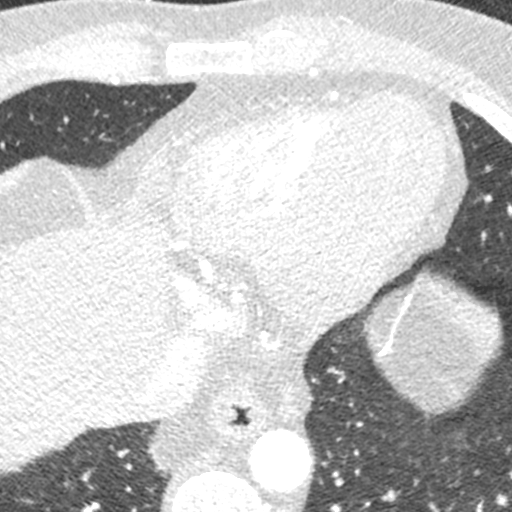
[im 202/303  lung]
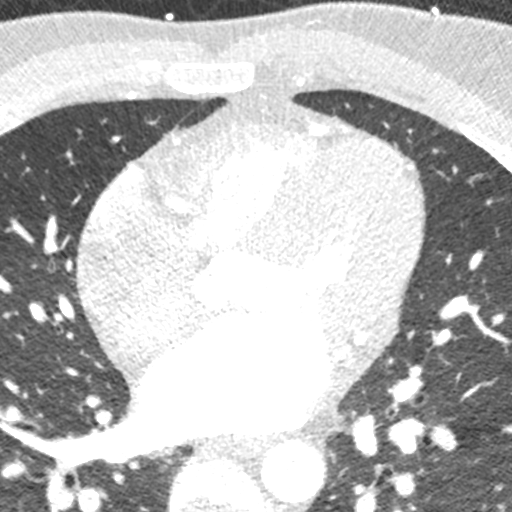

[8 of 20 positions shown; findings below may reference images not displayed]



Aorta: Normal size. Ascending aorta 3.0 cm. No calcifications. No
dissection.

Aortic Valve:  Trileaflet.  No calcifications.

Coronary Arteries:  Normal coronary origin.  Right dominance.

RCA is a large dominant artery that gives rise to PDA and PLVB.
There is no plaque.

Left main is a large artery that gives rise to LAD and LCX arteries.

LAD is a large vessel that has no plaque. There is a large,
branching diagonal that has no plaque.

LCX is a non-dominant artery that gives rise to two large OM
branches. There is no plaque.

Other findings:

Normal pulmonary vein drainage into the left atrium.

Normal let atrial appendage without a thrombus.

Normal size of the pulmonary artery.
IMPRESSION: 1. Coronary calcium score of 0. This was 0 percentile for age and
sex matched control.

2. Normal coronary origin with right dominance.

3. No evidence of CAD.

4.  Radiologist over-read of the chest to follow.

EXAM:
OVER-READ INTERPRETATION  CT CHEST

The following report is an over-read performed by radiologist Dr.
does not include interpretation of cardiac or coronary anatomy or
pathology. The coronary CTA interpretation by the cardiologist is
attached.
FINDINGS: Cardiovascular: No significant pericardial effusion/thickening.
Great vessels are normal in course and caliber. No central pulmonary
emboli.

Mediastinum/Nodes: Small fluid level in dependent lower thoracic
esophagus. No pathologically enlarged mediastinal or hilar lymph
nodes. Coarsely calcified nonenlarged subcarinal and right hilar
nodes from prior granulomatous disease.

Lungs/Pleura: No pneumothorax. No pleural effusion. No acute
consolidative airspace disease, lung masses or significant pulmonary
nodules.

Upper abdomen: Small hiatal hernia.

Musculoskeletal:  No aggressive appearing focal osseous lesions.
IMPRESSION: Small hiatal hernia. Fluid in the lower thoracic esophagus,
suggesting esophageal dysmotility and/or gastroesophageal reflux. No
additional significant extracardiac findings.

*** End of Addendum ***
